# Patient Record
Sex: Male | Born: 1965 | Race: White | Hispanic: No | Marital: Married | Smoking: Never smoker
Health system: Southern US, Community
[De-identification: ages and names within clinical notes are randomized; demographics above are authoritative.]

## PROBLEM LIST (undated history)

## (undated) DIAGNOSIS — I10 Essential (primary) hypertension: Secondary | ICD-10-CM

## (undated) DIAGNOSIS — Z8249 Family history of ischemic heart disease and other diseases of the circulatory system: Secondary | ICD-10-CM

## (undated) DIAGNOSIS — E7800 Pure hypercholesterolemia, unspecified: Secondary | ICD-10-CM

## (undated) DIAGNOSIS — R2230 Localized swelling, mass and lump, unspecified upper limb: Secondary | ICD-10-CM

## (undated) HISTORY — PX: VASECTOMY: SHX75

## (undated) HISTORY — DX: Pure hypercholesterolemia, unspecified: E78.00

## (undated) HISTORY — DX: Essential (primary) hypertension: I10

## (undated) HISTORY — DX: Family history of ischemic heart disease and other diseases of the circulatory system: Z82.49

## (undated) HISTORY — DX: Localized swelling, mass and lump, unspecified upper limb: R22.30

---

## 2017-07-10 DIAGNOSIS — E7800 Pure hypercholesterolemia, unspecified: Secondary | ICD-10-CM | POA: Diagnosis not present

## 2017-08-18 DIAGNOSIS — Z1211 Encounter for screening for malignant neoplasm of colon: Secondary | ICD-10-CM | POA: Diagnosis not present

## 2017-10-17 DIAGNOSIS — E7800 Pure hypercholesterolemia, unspecified: Secondary | ICD-10-CM | POA: Diagnosis not present

## 2018-03-26 DIAGNOSIS — R591 Generalized enlarged lymph nodes: Secondary | ICD-10-CM | POA: Diagnosis not present

## 2018-04-10 DIAGNOSIS — E7800 Pure hypercholesterolemia, unspecified: Secondary | ICD-10-CM | POA: Diagnosis not present

## 2018-04-10 DIAGNOSIS — Z0000 Encounter for general adult medical examination without abnormal findings: Secondary | ICD-10-CM | POA: Diagnosis not present

## 2018-04-10 DIAGNOSIS — Z125 Encounter for screening for malignant neoplasm of prostate: Secondary | ICD-10-CM | POA: Diagnosis not present

## 2018-04-10 DIAGNOSIS — Z23 Encounter for immunization: Secondary | ICD-10-CM | POA: Diagnosis not present

## 2018-05-21 DIAGNOSIS — D225 Melanocytic nevi of trunk: Secondary | ICD-10-CM | POA: Diagnosis not present

## 2018-05-21 DIAGNOSIS — Z872 Personal history of diseases of the skin and subcutaneous tissue: Secondary | ICD-10-CM | POA: Diagnosis not present

## 2018-05-21 DIAGNOSIS — L570 Actinic keratosis: Secondary | ICD-10-CM | POA: Diagnosis not present

## 2018-07-16 DIAGNOSIS — E7800 Pure hypercholesterolemia, unspecified: Secondary | ICD-10-CM | POA: Diagnosis not present

## 2019-03-02 ENCOUNTER — Ambulatory Visit (INDEPENDENT_AMBULATORY_CARE_PROVIDER_SITE_OTHER): Payer: BC Managed Care – PPO

## 2019-03-02 ENCOUNTER — Ambulatory Visit (HOSPITAL_COMMUNITY)
Admission: EM | Admit: 2019-03-02 | Discharge: 2019-03-02 | Disposition: A | Discharge status: Home / Self Care | Payer: BC Managed Care – PPO | Attending: Emergency Medicine | Admitting: Emergency Medicine

## 2019-03-02 ENCOUNTER — Other Ambulatory Visit: Payer: Self-pay

## 2019-03-02 ENCOUNTER — Encounter (HOSPITAL_COMMUNITY): Payer: Self-pay

## 2019-03-02 DIAGNOSIS — S5001XA Contusion of right elbow, initial encounter: Secondary | ICD-10-CM | POA: Diagnosis not present

## 2019-03-02 DIAGNOSIS — S59901A Unspecified injury of right elbow, initial encounter: Secondary | ICD-10-CM | POA: Diagnosis not present

## 2019-03-02 DIAGNOSIS — M25521 Pain in right elbow: Secondary | ICD-10-CM | POA: Diagnosis not present

## 2019-03-02 MED ORDER — IBUPROFEN 600 MG PO TABS: 600.0000 mg | ORAL_TABLET | Freq: Four times a day (QID) | ORAL | 0 refills | Status: AC | PRN

## 2019-03-02 NOTE — Discharge Instructions (Signed)
Take the prescribed ibuprofen as needed for your pain.   Follow-up with the orthopedic listed or your orthopedist if your pain continues, if the numbness/tingling in your fingers continue, if you develop weakness, or other concerns.

## 2019-03-02 NOTE — ED Notes (Signed)
Bed: UC04 Expected date: 03/02/19 Expected time: 11:21 AM Means of arrival: Car Comments: APPT 11:30

## 2019-03-02 NOTE — ED Provider Notes (Signed)
[place]    CSN: [id number] Arrival date & time: 03/02/19  1112      History   Chief Complaint Chief Complaint  Patient presents with  . Arm Injury    HPI Charles James is a 53 y.o. male.   Patient presents with abrasion and pain on his right forearm and elbow after falling 2 hours PTA.  He denies head injury or LOC.  He states he slipped and fell on the steps.  He reports intermittent numbness and tingling in his right fourth and fifth fingers which is improving.  The history is provided by the patient.    History reviewed. No pertinent past medical history.  There are no active problems to display for this patient.   History reviewed. No pertinent surgical history.     Home Medications    Prior to Admission medications   Medication Sig Start Date End Date Taking? Authorizing Provider  ibuprofen (ADVIL) 600 MG tablet Take 1 tablet (600 mg total) by mouth every 6 (six) hours as needed. 03/02/19   Sharion Balloon, NP    Family History History reviewed. No pertinent family history.  Social History Social History   Tobacco Use  . Smoking status: Never Smoker  . Smokeless tobacco: Never Used  Substance Use Topics  . Alcohol use: Not on file  . Drug use: Not on file     Allergies   Patient has no known allergies.   Review of Systems Review of Systems  Constitutional: Negative for chills and fever.  HENT: Negative for ear pain and sore throat.   Eyes: Negative for pain and visual disturbance.  Respiratory: Negative for cough and shortness of breath.   Cardiovascular: Negative for chest pain and palpitations.  Gastrointestinal: Negative for abdominal pain and vomiting.  Genitourinary: Negative for dysuria and hematuria.  Musculoskeletal: Positive for arthralgias. Negative for back pain.  Skin: Positive for wound. Negative for color change and rash.  Neurological: Negative for seizures and syncope.  All other systems reviewed and are negative.     Physical Exam Triage Vital Signs ED Triage Vitals  Enc Vitals Group     BP 03/02/19 1136 128/79     Pulse Rate 03/02/19 1136 68     Resp 03/02/19 1136 16     Temp 03/02/19 1136 98.7 F (37.1 C)     Temp Source 03/02/19 1136 Oral     SpO2 03/02/19 1136 100 %     Weight --      Height --      Head Circumference --      Peak Flow --      Pain Score 03/02/19 1137 3     Pain Loc --      Pain Edu? --      Excl. in [place]? --    No data found.  Updated Vital Signs BP 128/79 (BP Location: Left Arm)   Pulse 68   Temp 98.7 F (37.1 C) (Oral)   Resp 16   SpO2 100%   Visual Acuity Right Eye Distance:   Left Eye Distance:   Bilateral Distance:    Right Eye Near:   Left Eye Near:    Bilateral Near:     Physical Exam Vitals signs and nursing note reviewed.  Constitutional:      Appearance: He is well-developed.  HENT:     Head: Normocephalic and atraumatic.     Right Ear: Tympanic membrane normal.     Left Ear:  Tympanic membrane normal.     Nose: Nose normal.     Mouth/Throat:     Mouth: Mucous membranes are moist.     Pharynx: Oropharynx is clear.  Eyes:     Conjunctiva/sclera: Conjunctivae normal.  Neck:     Musculoskeletal: Neck supple.  Cardiovascular:     Rate and Rhythm: Normal rate and regular rhythm.     Heart sounds: No murmur.  Pulmonary:     Effort: Pulmonary effort is normal. No respiratory distress.     Breath sounds: Normal breath sounds.  Abdominal:     General: Bowel sounds are normal.     Palpations: Abdomen is soft.     Tenderness: There is no abdominal tenderness. There is no guarding or rebound.  Musculoskeletal: Normal range of motion.        General: Tenderness present. No swelling or deformity.     Comments: Right elbow and forearm: abrasion and tender to palpation.   Skin:    General: Skin is warm and dry.     Capillary Refill: Capillary refill takes less than 2 seconds.  Neurological:     General: No focal deficit present.      Mental Status: He is alert and oriented to person, place, and time.     Sensory: No sensory deficit.     Motor: No weakness.      UC Treatments / Results  Labs (all labs ordered are listed, but only abnormal results are displayed) Labs Reviewed - No data to display  EKG   Radiology Dg Elbow Complete Right  Result Date: 03/02/2019 CLINICAL DATA:  Per pt: fell this morning around 9:30, landed on the right back of the forearm. Patient pointed to the posterior olecranon, radiating pain distally. No prior injury to the right forearm. Patient is not a diabetic pain EXAM: RIGHT ELBOW - COMPLETE 3+ VIEW COMPARISON:  None. FINDINGS: No evidence of fracture of the ulna or humerus. The radial head is normal. No joint effusion. IMPRESSION: No fracture or dislocation. Electronically Signed   By: Genevive BiStewart  Edmunds M.D.   On: 03/02/2019 12:22    Procedures Procedures (including critical care time)  Medications Ordered in UC Medications - No data to display  Initial Impression / Assessment and Plan / UC Course  I have reviewed the triage vital signs and the nursing notes.  Pertinent labs & imaging results that were available during my care of the patient were reviewed by me and considered in my medical decision making (see chart for details).    Contusion of right elbow.  X-ray negative.  Treating with ibuprofen.  Instructed patient to follow-up with an orthopedist if his pain continues, if the numbness and tingling in his fingers continue, if he develops weakness, or other concerns.  Patient agrees to plan of care.     Final Clinical Impressions(s) / UC Diagnoses   Final diagnoses:  Contusion of right elbow, initial encounter     Discharge Instructions     Take the prescribed ibuprofen as needed for your pain.   Follow-up with the orthopedic listed or your orthopedist if your pain continues, if the numbness/tingling in your fingers continue, if you develop weakness, or other concerns.          ED Prescriptions    Medication Sig Dispense Auth. Provider   ibuprofen (ADVIL) 600 MG tablet Take 1 tablet (600 mg total) by mouth every 6 (six) hours as needed. 30 tablet Mickie Bailate, Latice Waitman H, NP  PDMP not reviewed this encounter.   Mickie Bail, NP 03/02/19 1242

## 2019-03-02 NOTE — ED Triage Notes (Signed)
Pt present right elbow injury from slipping down some steps.  Pt states he was at the second step from the bottom when he slipped. Pt right elbow is bruised

## 2019-05-20 DIAGNOSIS — Z0000 Encounter for general adult medical examination without abnormal findings: Secondary | ICD-10-CM | POA: Diagnosis not present

## 2019-05-24 DIAGNOSIS — Z5181 Encounter for therapeutic drug level monitoring: Secondary | ICD-10-CM | POA: Diagnosis not present

## 2019-05-24 DIAGNOSIS — E7800 Pure hypercholesterolemia, unspecified: Secondary | ICD-10-CM | POA: Diagnosis not present

## 2019-05-24 DIAGNOSIS — Z125 Encounter for screening for malignant neoplasm of prostate: Secondary | ICD-10-CM | POA: Diagnosis not present

## 2019-07-06 DIAGNOSIS — Z20822 Contact with and (suspected) exposure to covid-19: Secondary | ICD-10-CM | POA: Diagnosis not present

## 2019-07-08 DIAGNOSIS — D225 Melanocytic nevi of trunk: Secondary | ICD-10-CM | POA: Diagnosis not present

## 2019-07-08 DIAGNOSIS — L814 Other melanin hyperpigmentation: Secondary | ICD-10-CM | POA: Diagnosis not present

## 2019-07-08 DIAGNOSIS — X32XXXS Exposure to sunlight, sequela: Secondary | ICD-10-CM | POA: Diagnosis not present

## 2019-07-08 DIAGNOSIS — G548 Other nerve root and plexus disorders: Secondary | ICD-10-CM | POA: Diagnosis not present

## 2019-08-17 ENCOUNTER — Ambulatory Visit: Payer: BC Managed Care – PPO | Attending: Internal Medicine

## 2019-08-17 DIAGNOSIS — Z23 Encounter for immunization: Secondary | ICD-10-CM

## 2019-08-17 NOTE — Progress Notes (Signed)
   Covid-19 Vaccination Clinic  Name:  Charles James    MRN: [id number] DOB: 01/03/1966  08/17/2019  [name] was observed post Covid-19 immunization for 15 minutes without incident. He was provided with Vaccine Information Sheet and instruction to access the V-Safe system.   [name] was instructed to call 911 with any severe reactions post vaccine: Marland Kitchen Difficulty breathing  . Swelling of face and throat  . A fast heartbeat  . A bad rash all over body  . Dizziness and weakness   Immunizations Administered    Name Date Dose VIS Date Route   Pfizer COVID-19 Vaccine 08/17/2019 10:36 AM 0.3 mL 05/17/2019 Intramuscular   Manufacturer: [employer], [employer]   Lot: [id number]   NDC: [address]-5

## 2019-09-09 ENCOUNTER — Ambulatory Visit: Payer: BC Managed Care – PPO | Attending: Internal Medicine

## 2019-09-09 ENCOUNTER — Ambulatory Visit: Payer: BC Managed Care – PPO

## 2019-09-09 DIAGNOSIS — Z23 Encounter for immunization: Secondary | ICD-10-CM

## 2019-09-09 NOTE — Progress Notes (Signed)
   Covid-19 Vaccination Clinic  Name:  Charles James    MRN: [id number] DOB: December 26, 1965  09/09/2019  [name] was observed post Covid-19 immunization for 15 minutes without incident. He was provided with Vaccine Information Sheet and instruction to access the V-Safe system.   [name] was instructed to call 911 with any severe reactions post vaccine: Marland Kitchen Difficulty breathing  . Swelling of face and throat  . A fast heartbeat  . A bad rash all over body  . Dizziness and weakness   Immunizations Administered    Name Date Dose VIS Date Route   Pfizer COVID-19 Vaccine 09/09/2019  3:39 PM 0.3 mL 05/17/2019 Intramuscular   Manufacturer: [employer], [employer]   Lot: [id number]   NDC: [address]-2

## 2020-02-07 DIAGNOSIS — Z20822 Contact with and (suspected) exposure to covid-19: Secondary | ICD-10-CM | POA: Diagnosis not present

## 2020-02-12 DIAGNOSIS — Z20822 Contact with and (suspected) exposure to covid-19: Secondary | ICD-10-CM | POA: Diagnosis not present

## 2020-02-19 DIAGNOSIS — S30861A Insect bite (nonvenomous) of abdominal wall, initial encounter: Secondary | ICD-10-CM | POA: Diagnosis not present

## 2020-02-19 DIAGNOSIS — R509 Fever, unspecified: Secondary | ICD-10-CM | POA: Diagnosis not present

## 2020-02-19 DIAGNOSIS — R5383 Other fatigue: Secondary | ICD-10-CM | POA: Diagnosis not present

## 2020-02-19 DIAGNOSIS — R0789 Other chest pain: Secondary | ICD-10-CM | POA: Diagnosis not present

## 2020-02-20 ENCOUNTER — Ambulatory Visit
Admission: RE | Admit: 2020-02-20 | Discharge: 2020-02-20 | Disposition: A | Discharge status: Home / Self Care | Payer: BC Managed Care – PPO | Source: Ambulatory Visit | Attending: Physician Assistant | Admitting: Physician Assistant

## 2020-02-20 ENCOUNTER — Other Ambulatory Visit: Payer: Self-pay | Admitting: Physician Assistant

## 2020-02-20 DIAGNOSIS — R509 Fever, unspecified: Secondary | ICD-10-CM

## 2020-07-06 ENCOUNTER — Ambulatory Visit
Admission: RE | Admit: 2020-07-06 | Discharge: 2020-07-06 | Disposition: A | Discharge status: Home / Self Care | Payer: BC Managed Care – PPO | Source: Ambulatory Visit | Attending: Physician Assistant | Admitting: Physician Assistant

## 2020-07-06 ENCOUNTER — Other Ambulatory Visit: Payer: Self-pay | Admitting: Physician Assistant

## 2020-07-06 DIAGNOSIS — M7989 Other specified soft tissue disorders: Secondary | ICD-10-CM

## 2020-07-26 NOTE — Progress Notes (Signed)
Cardiology Office Note:    Date:  07/29/2020   ID:  Charles James, DOB 1966/05/10, MRN [id number]  PCP:  Milus Height, PA   [facility]  Cardiologist:  No primary care provider on file.  Advanced Practice Provider:  No care team member to display Electrophysiologist:  None    Referring MD: Milus Height, PA     History of Present Illness:    Charles James is a 55 y.o. male with a strong family history of coronary artery disease who was referred by Milus Height, PA for further evaluation of cardiac risk factors.  Patient states that he overall feels well. No exertional chest pain, SOB, lightheadedness, dizziness or syncope. No LE edema or orthopnea. Was on crestor 5mg  but had muscle aches with it so it was stopped. Patient is active and works out 20-70min per day 5-6 day/week.   Family history: Brother with MI 50 and required bypass (not smoker); younger brother with hypertension. Mother with HTN, HLD. Father with HTN, HLD.   Past Medical History:  Diagnosis Date  . Family history of coronary artery bypass surgery   . High cholesterol   . Hypertension   . Mass of shoulder region     Past Surgical History:  Procedure Laterality Date  . VASECTOMY      Current Medications: Current Meds  Medication Sig  . cetirizine (ZYRTEC) 10 MG tablet Take 10 mg by mouth daily.  52 ibuprofen (ADVIL) 600 MG tablet Take 1 tablet (600 mg total) by mouth every 6 (six) hours as needed.  . Multiple Vitamins-Minerals (ONE DAILY FOR MEN/LYCOPENE PO) Take by mouth.     Allergies:   Atorvastatin   Social History   Socioeconomic History  . Marital status: Married    Spouse name: Not on file  . Number of children: Not on file  . Years of education: Not on file  . Highest education level: Not on file  Occupational History  . Not on file  Tobacco Use  . Smoking status: Never Smoker  . Smokeless tobacco: Never Used  Substance and Sexual Activity  . Alcohol  use: Yes  . Drug use: Not on file  . Sexual activity: Yes    Comment: married  Other Topics Concern  . Not on file  Social History Narrative  . Not on file   Social Determinants of Health   Financial Resource Strain: Not on file  Food Insecurity: Not on file  Transportation Needs: Not on file  Physical Activity: Not on file  Stress: Not on file  Social Connections: Not on file     Family History: The patient's family history includes Heart disease in his brother; Hypertension in his brother, brother, father, and mother.  ROS:   Please see the history of present illness.    Review of Systems  Constitutional: Negative for chills and fever.  HENT: Negative for hearing loss and sore throat.   Eyes: Negative for blurred vision and redness.  Respiratory: Negative for cough and shortness of breath.   Cardiovascular: Negative for chest pain, palpitations, orthopnea, claudication, leg swelling and PND.  Gastrointestinal: Negative for melena, nausea and vomiting.  Genitourinary: Negative for dysuria and flank pain.  Musculoskeletal: Positive for joint pain. Negative for falls and myalgias.  Skin: Negative for rash.  Neurological: Negative for dizziness and loss of consciousness.  Endo/Heme/Allergies: Negative for polydipsia.  Psychiatric/Behavioral: Negative for substance abuse.    EKGs/Labs/Other Studies Reviewed:    The following  studies were reviewed today: No cardiac studies  EKG:  EKG is  ordered today.  The ekg ordered today demonstrates NSR, LAFB, HR 79  Recent Labs: No results found for requested labs within last 8760 hours.  Recent Lipid Panel No results found for: CHOL, TRIG, HDL, CHOLHDL, VLDL, LDLCALC, LDLDIRECT     Physical Exam:    VS:  BP 138/86   Pulse 79   Ht 5\' 10"  (1.778 m)   Wt 179 lb (81.2 kg)   SpO2 98%   BMI 25.68 kg/m     Wt Readings from Last 3 Encounters:  07/29/20 179 lb (81.2 kg)     GEN:  Well nourished, well developed in no acute  distress HEENT: Normal NECK: No JVD; No carotid bruits CARDIAC: RRR, 1/6 systolic murmur. No rubs, gallops RESPIRATORY:  Clear to auscultation without rales, wheezing or rhonchi  ABDOMEN: Soft, non-tender, non-distended MUSCULOSKELETAL:  No edema; No deformity  SKIN: Warm and dry NEUROLOGIC:  Alert and oriented x 3 PSYCHIATRIC:  Normal affect   ASSESSMENT:    1. Family history of coronary artery bypass graft    PLAN:    In order of problems listed above:  #Strong Family History of CAD: #CV Risk Stratification: Patient with a history of premature CAD with a brother with history of MI requiring bypass surgery at age 16. Patient is currently asymptomatic and active exercising 20-26min a day 6x/week. Was previously on crestor but did not tolerate due to myalgias. LDL 112. Will risk stratify with coronary calcium score. -Coronary calcium score -Pending calcium score, can consider re-trialing statin +/- ASA -Continue lifestyle modifications with diet and exercise as detailed below  Exercise recommendations: Goal of exercising for at least 30 minutes a day, at least 5 times per week.  Please exercise to a moderate exertion.  This means that while exercising it is difficult to speak in full sentences, however you are not so short of breath that you feel you must stop, and not so comfortable that you can carry on a full conversation.  Exertion level should be approximately a 5/10, if 10 is the most exertion you can perform.  Diet recommendations: Recommend a heart healthy diet such as the Mediterranean diet.  This diet consists of plant based foods, healthy fats, lean meats, olive oil.  It suggests limiting the intake of simple carbohydrates such as white breads, pastries, and pastas.  It also limits the amount of red meat, wine, and dairy products such as cheese that one should consume on a daily basis.   Medication Adjustments/Labs and Tests Ordered: Current medicines are reviewed at  length with the patient today.  Concerns regarding medicines are outlined above.  Orders Placed This Encounter  Procedures  . CT CARDIAC SCORING (SELF PAY ONLY)  . EKG 12-Lead   No orders of the defined types were placed in this encounter.   Patient Instructions  Medication Instructions:  Your physician recommends that you continue on your current medications as directed. Please refer to the Current Medication list given to you today.  *If you need a refill on your cardiac medications before your next appointment, please call your pharmacy*   Lab Work: None ordered  If you have labs (blood work) drawn today and your tests are completely normal, you will receive your results only by: 31m MyChart Message (if you have MyChart) OR . A paper copy in the mail If you have any lab test that is abnormal or we need to change your  treatment, we will call you to review the results.   Testing/Procedures: Your physician recommends that you have a CT Calcium Score.   Follow-Up: At [facility] you and your health needs are our priority.  As part of our continuing mission to provide you with exceptional heart care, we have created designated Provider Care Teams.  These Care Teams include your primary Cardiologist (physician) and Advanced Practice Providers (APPs -  Physician Assistants and Nurse Practitioners) who all work together to provide you with the care you need, when you need it.  We recommend signing up for the patient portal called "MyChart".  Sign up information is provided on this After Visit Summary.  MyChart is used to connect with patients for Virtual Visits (Telemedicine).  Patients are able to view lab/test results, encounter notes, upcoming appointments, etc.  Non-urgent messages can be sent to your provider as well.   To learn more about what you can do with MyChart, go to [email or web address].    Your next appointment:   12 month(s)  The format for your next appointment:    In Person  Provider:   Laurance Flatten, MD   Other Instructions      Signed, Meriam Sprague, MD  07/29/2020 9:23 AM    [facility]

## 2020-07-29 ENCOUNTER — Other Ambulatory Visit: Payer: Self-pay

## 2020-07-29 ENCOUNTER — Encounter: Payer: Self-pay | Admitting: Cardiology

## 2020-07-29 ENCOUNTER — Ambulatory Visit: Payer: BC Managed Care – PPO | Admitting: Cardiology

## 2020-07-29 VITALS — BP 138/86 | HR 79 | Ht 70.0 in | Wt 179.0 lb

## 2020-07-29 DIAGNOSIS — Z8249 Family history of ischemic heart disease and other diseases of the circulatory system: Secondary | ICD-10-CM | POA: Diagnosis not present

## 2020-07-29 NOTE — Progress Notes (Signed)
Cardiology Office Note:    Date:  07/29/2020   ID:  Charles James, DOB 14-Mar-1966, MRN [id number]  PCP:  Milus Height, PA   [facility]  Cardiologist:  No primary care provider on file.  Advanced Practice Provider:  No care team member to display Electrophysiologist:  None    Referring MD: Milus Height, PA     History of Present Illness:    Charles James is a 55 y.o. male with a strong family history of coronary artery disease who was referred by Milus Height, PA for further evaluation of cardiac risk factors.  Patient states that he overall feels well. No exertional chest pain, SOB, lightheadedness, dizziness or syncope. No LE edema or orthopnea. Was on crestor 5mg  but had muscle aches with it so it was stopped. LDL 112. Patient is active and works out 20-54min per day 5-6 day/week. Blood pressures at home run 120-140s but most often in 120s. Not on blood pressure medication.  Family history: Brother with MI 39 and required bypass (not smoker); younger brother with hypertension. Mother with HTN, HLD. Father with HTN, HLD.   Past Medical History:  Diagnosis Date  . Family history of coronary artery bypass surgery   . High cholesterol   . Hypertension   . Mass of shoulder region     Past Surgical History:  Procedure Laterality Date  . VASECTOMY      Current Medications: Current Meds  Medication Sig  . cetirizine (ZYRTEC) 10 MG tablet Take 10 mg by mouth daily.  52 ibuprofen (ADVIL) 600 MG tablet Take 1 tablet (600 mg total) by mouth every 6 (six) hours as needed.  . Multiple Vitamins-Minerals (ONE DAILY FOR MEN/LYCOPENE PO) Take by mouth.     Allergies:   Atorvastatin   Social History   Socioeconomic History  . Marital status: Married    Spouse name: Not on file  . Number of children: Not on file  . Years of education: Not on file  . Highest education level: Not on file  Occupational History  . Not on file  Tobacco Use  . Smoking  status: Never Smoker  . Smokeless tobacco: Never Used  Substance and Sexual Activity  . Alcohol use: Yes  . Drug use: Not on file  . Sexual activity: Yes    Comment: married  Other Topics Concern  . Not on file  Social History Narrative  . Not on file   Social Determinants of Health   Financial Resource Strain: Not on file  Food Insecurity: Not on file  Transportation Needs: Not on file  Physical Activity: Not on file  Stress: Not on file  Social Connections: Not on file     Family History: The patient's family history includes Heart disease in his brother; Hypertension in his brother, brother, father, and mother.  ROS:   Please see the history of present illness.    Review of Systems  Constitutional: Negative for chills and fever.  HENT: Negative for nosebleeds.   Eyes: Negative for blurred vision and redness.  Respiratory: Negative for shortness of breath.   Cardiovascular: Negative for chest pain, palpitations, orthopnea, claudication, leg swelling and PND.  Gastrointestinal: Negative for melena, nausea and vomiting.  Genitourinary: Negative for frequency.  Musculoskeletal: Positive for joint pain. Negative for falls and myalgias.  Neurological: Negative for dizziness and loss of consciousness.  Endo/Heme/Allergies: Negative for polydipsia.  Psychiatric/Behavioral: Negative for substance abuse.    EKGs/Labs/Other Studies Reviewed:  The following studies were reviewed today: No cardiac studies  EKG:  EKG is  ordered today.  The ekg ordered today demonstrates NSR with HR 79; LAFB  Recent Labs: No results found for requested labs within last 8760 hours.  Recent Lipid Panel No results found for: CHOL, TRIG, HDL, CHOLHDL, VLDL, LDLCALC, LDLDIRECT   Risk Assessment/Calculations:       Physical Exam:    VS:  BP 138/86   Pulse 79   Ht 5\' 10"  (1.778 m)   Wt 179 lb (81.2 kg)   SpO2 98%   BMI 25.68 kg/m     Wt Readings from Last 3 Encounters:  07/29/20  179 lb (81.2 kg)     GEN:  Well nourished, well developed in no acute distress HEENT: Normal NECK: No JVD; No carotid bruits CARDIAC: RRR, 1/6 systolic murmur. No rubs, gallops RESPIRATORY:  Clear to auscultation without rales, wheezing or rhonchi  ABDOMEN: Soft, non-tender, non-distended MUSCULOSKELETAL:  No edema; No deformity  SKIN: Warm and dry NEUROLOGIC:  Alert and oriented x 3 PSYCHIATRIC:  Normal affect   ASSESSMENT:    1. Family history of coronary artery bypass graft    PLAN:    In order of problems listed above:  #Strong Family History of CAD: #CV Risk Stratification: Patient has strong family history of CAD where brother had MI requiring bypass surgery at age 52. He is currently asymptomatic and active. LDL 112 off crestor currently due to muscle aches. Blood pressure generally 120s at home off medications. Exercises 20-46min a day 6 days per week without issues. Will risk stratify with coronary calcium score -Coronary calcium score -Pending score, will consider trial of different statin +/- ASA      Medication Adjustments/Labs and Tests Ordered: Current medicines are reviewed at length with the patient today.  Concerns regarding medicines are outlined above.  Orders Placed This Encounter  Procedures  . CT CARDIAC SCORING (SELF PAY ONLY)  . EKG 12-Lead   No orders of the defined types were placed in this encounter.   Patient Instructions  Medication Instructions:  Your physician recommends that you continue on your current medications as directed. Please refer to the Current Medication list given to you today.  *If you need a refill on your cardiac medications before your next appointment, please call your pharmacy*   Lab Work: None ordered  If you have labs (blood work) drawn today and your tests are completely normal, you will receive your results only by: 31m MyChart Message (if you have MyChart) OR . A paper copy in the mail If you have any lab test  that is abnormal or we need to change your treatment, we will call you to review the results.   Testing/Procedures: Your physician recommends that you have a CT Calcium Score.   Follow-Up: At [facility], you and your health needs are our priority.  As part of our continuing mission to provide you with exceptional heart care, we have created designated Provider Care Teams.  These Care Teams include your primary Cardiologist (physician) and Advanced Practice Providers (APPs -  Physician Assistants and Nurse Practitioners) who all work together to provide you with the care you need, when you need it.  We recommend signing up for the patient portal called "MyChart".  Sign up information is provided on this After Visit Summary.  MyChart is used to connect with patients for Virtual Visits (Telemedicine).  Patients are able to view lab/test results, encounter notes, upcoming appointments, etc.  Non-urgent messages can be sent to your provider as well.   To learn more about what you can do with MyChart, go to [email or web address].    Your next appointment:   12 month(s)  The format for your next appointment:   In Person  Provider:   Laurance Flatten, MD   Other Instructions      Signed, Meriam Sprague, MD  07/29/2020 9:16 AM    [facility]

## 2020-07-29 NOTE — Patient Instructions (Addendum)
Medication Instructions:  Your physician recommends that you continue on your current medications as directed. Please refer to the Current Medication list given to you today.  *If you need a refill on your cardiac medications before your next appointment, please call your pharmacy*   Lab Work: None ordered  If you have labs (blood work) drawn today and your tests are completely normal, you will receive your results only by: Marland Kitchen MyChart Message (if you have MyChart) OR . A paper copy in the mail If you have any lab test that is abnormal or we need to change your treatment, we will call you to review the results.   Testing/Procedures: Your physician recommends that you have a CT Calcium Score.   Follow-Up: At [facility], you and your health needs are our priority.  As part of our continuing mission to provide you with exceptional heart care, we have created designated Provider Care Teams.  These Care Teams include your primary Cardiologist (physician) and Advanced Practice Providers (APPs -  Physician Assistants and Nurse Practitioners) who all work together to provide you with the care you need, when you need it.  We recommend signing up for the patient portal called "MyChart".  Sign up information is provided on this After Visit Summary.  MyChart is used to connect with patients for Virtual Visits (Telemedicine).  Patients are able to view lab/test results, encounter notes, upcoming appointments, etc.  Non-urgent messages can be sent to your provider as well.   To learn more about what you can do with MyChart, go to [email or web address].    Your next appointment:   12 month(s)  The format for your next appointment:   In Person  Provider:   Laurance Flatten, MD   Other Instructions

## 2020-08-18 ENCOUNTER — Other Ambulatory Visit: Payer: Self-pay

## 2020-08-18 ENCOUNTER — Ambulatory Visit (INDEPENDENT_AMBULATORY_CARE_PROVIDER_SITE_OTHER)
Admission: RE | Admit: 2020-08-18 | Discharge: 2020-08-18 | Disposition: A | Discharge status: Home / Self Care | Payer: Self-pay | Source: Ambulatory Visit | Attending: Cardiology | Admitting: Cardiology

## 2020-08-18 DIAGNOSIS — Z8249 Family history of ischemic heart disease and other diseases of the circulatory system: Secondary | ICD-10-CM

## 2020-08-20 ENCOUNTER — Telehealth: Payer: Self-pay | Admitting: Cardiology

## 2020-08-20 NOTE — Telephone Encounter (Signed)
Patient is calling to get results from CT

## 2020-08-20 NOTE — Telephone Encounter (Signed)
Per Dr. Shari Prows, his calcium score is 0. This is great news and means there is no significant plaque in his arteries. He does not need a statin or aspirin at this time but needs to continue healthy diet and daily exercise. Patient verbalized understanding.

## 2021-07-14 DIAGNOSIS — L578 Other skin changes due to chronic exposure to nonionizing radiation: Secondary | ICD-10-CM | POA: Diagnosis not present

## 2021-07-14 DIAGNOSIS — L814 Other melanin hyperpigmentation: Secondary | ICD-10-CM | POA: Diagnosis not present

## 2021-07-14 DIAGNOSIS — X32XXXS Exposure to sunlight, sequela: Secondary | ICD-10-CM | POA: Diagnosis not present

## 2021-07-14 DIAGNOSIS — D225 Melanocytic nevi of trunk: Secondary | ICD-10-CM | POA: Diagnosis not present

## 2021-07-14 DIAGNOSIS — L570 Actinic keratosis: Secondary | ICD-10-CM | POA: Diagnosis not present

## 2021-07-21 DIAGNOSIS — Z0000 Encounter for general adult medical examination without abnormal findings: Secondary | ICD-10-CM | POA: Diagnosis not present

## 2021-07-21 DIAGNOSIS — F339 Major depressive disorder, recurrent, unspecified: Secondary | ICD-10-CM | POA: Diagnosis not present

## 2021-07-21 DIAGNOSIS — E7800 Pure hypercholesterolemia, unspecified: Secondary | ICD-10-CM | POA: Diagnosis not present

## 2021-07-21 DIAGNOSIS — Z131 Encounter for screening for diabetes mellitus: Secondary | ICD-10-CM | POA: Diagnosis not present

## 2021-07-21 DIAGNOSIS — Z125 Encounter for screening for malignant neoplasm of prostate: Secondary | ICD-10-CM | POA: Diagnosis not present

## 2021-08-12 IMAGING — CT CT CARDIAC CORONARY ARTERY CALCIUM SCORE
3 series · 14 of 20 positions shown, 15 images · non-contrast
Comparison: None.
COMPARISON: None.

Addendum:
EXAM:
OVER-READ INTERPRETATION  CT CHEST

The following report is an over-read performed by radiologist Dr.
Jun Hoffmann [REDACTED] on 08/18/2020. This
over-read does not include interpretation of cardiac or coronary
anatomy or pathology. The coronary calcium score interpretation by
the cardiologist is attached.
CLINICAL DATA: 55M with hypertension, hyperlipidemia and family
history of premature CAD for risk stratification
Coronary Calcium Score
TECHNIQUE: The patient was scanned on a Siemens Force scanner. Axial
non-contrast 3 mm slices were carried out through the heart. The
data set was analyzed on a dedicated work station and scored using
the Agatson method.

[Series 2: casc 3.0 bv41 2 bestdiast 73 % · axial · 0.46mm/px · z∈[-229,-157]mm · 4 of 42 slices shown, 5 images]
[im 9/42  vessel]
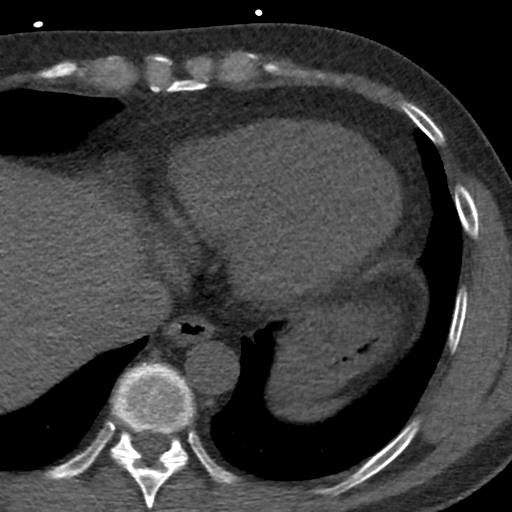
[im 9/42  lung]
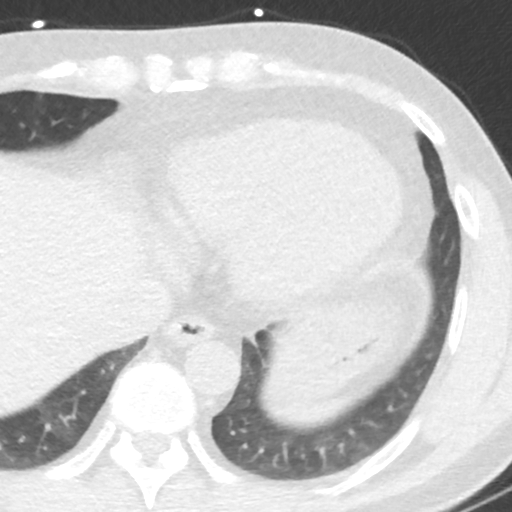
[im 17/42  vessel]
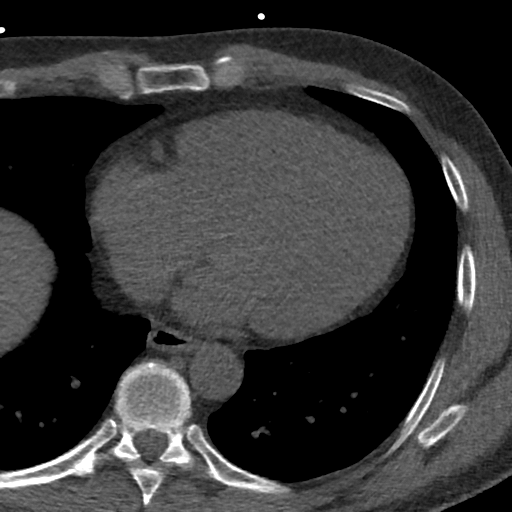
[im 25/42  vessel]
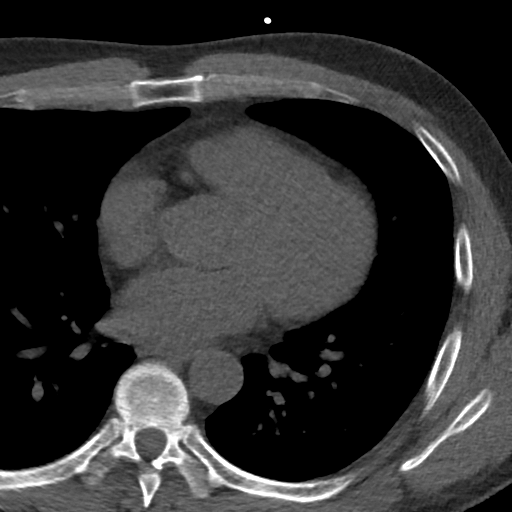
[im 33/42  vessel]
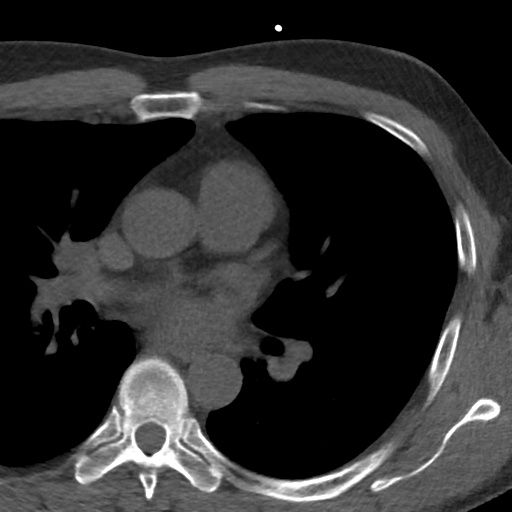

[Series 3: lung 73 % · axial · 0.72mm/px · z∈[-235,-151]mm · 5 of 43 slices shown]
[im 8/43  lung]
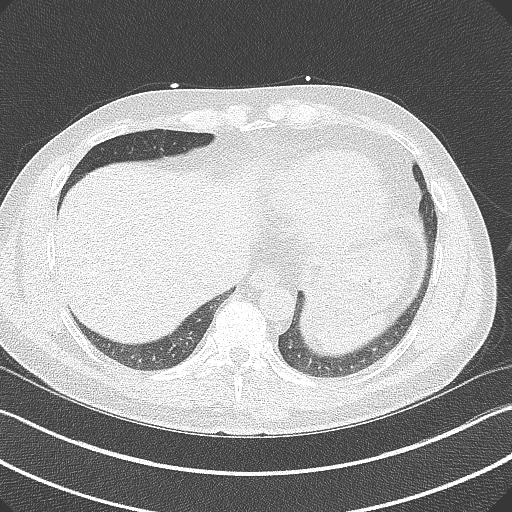
[im 15/43  lung]
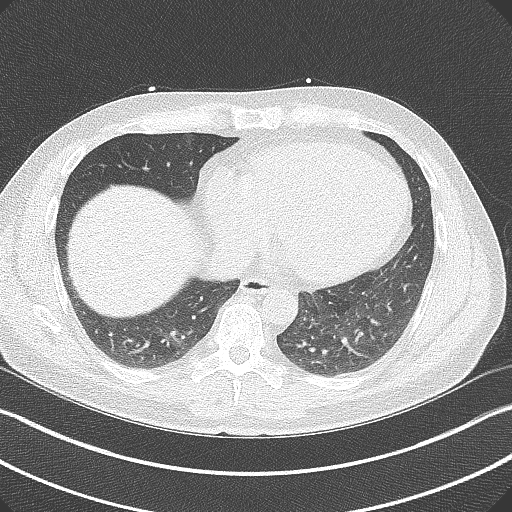
[im 22/43  lung]
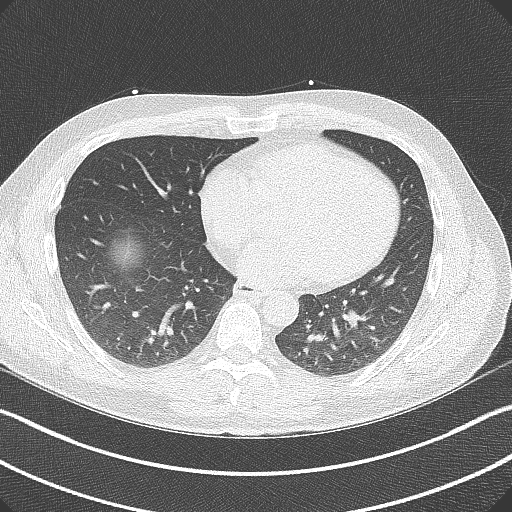
[im 29/43  lung]
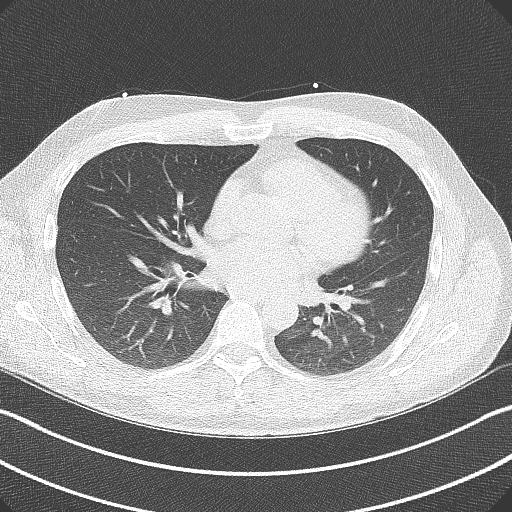
[im 36/43  lung]
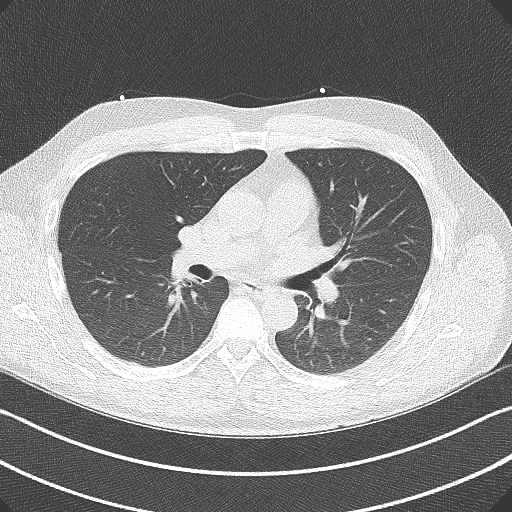

[Series 4: lung st 73 % · axial · 0.72mm/px · z∈[-235,-151]mm · 5 of 43 slices shown]
[im 8/43  lung]
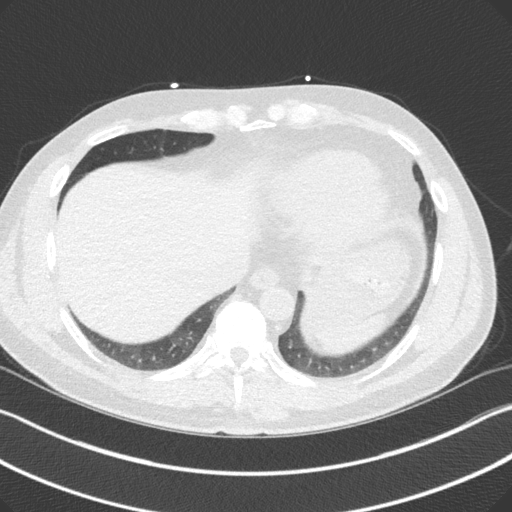
[im 15/43  lung]
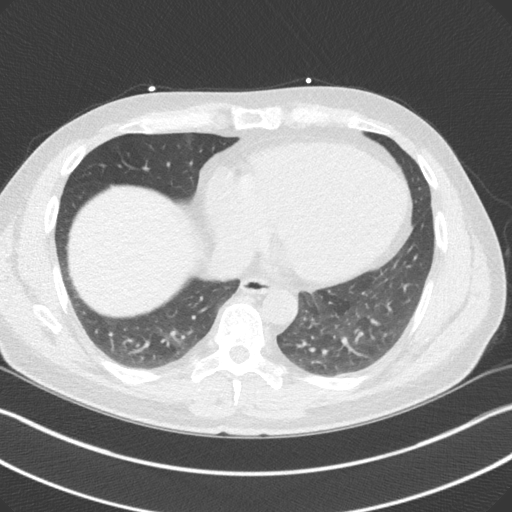
[im 22/43  lung]
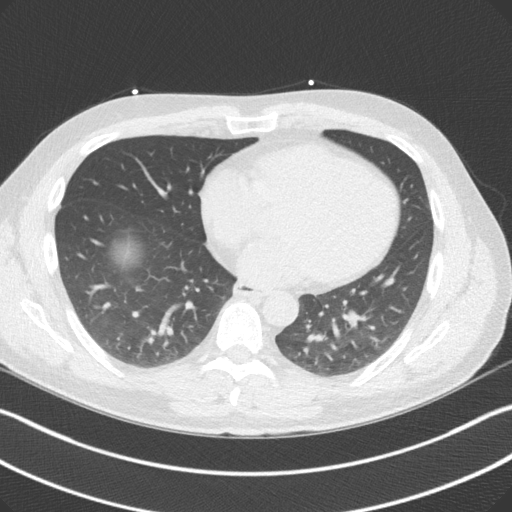
[im 29/43  lung]
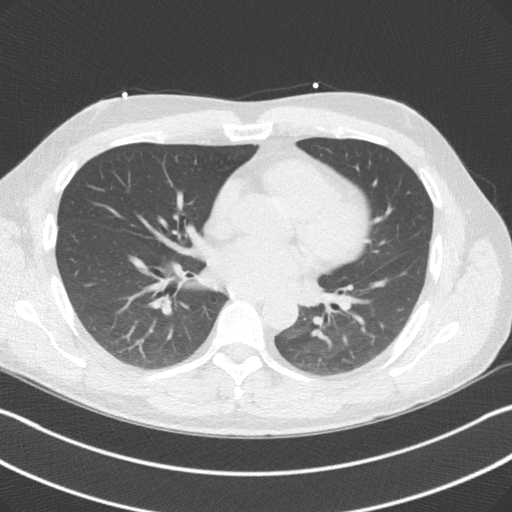
[im 36/43  lung]
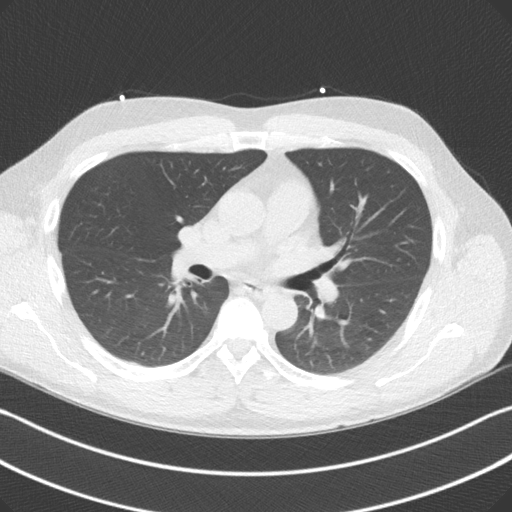

[14 of 20 positions shown; findings below may reference images not displayed]

FINDINGS: Within the visualized portions of the thorax there are no suspicious
appearing pulmonary nodules or masses, there is no acute
consolidative airspace disease, no pleural effusions, no
pneumothorax and no lymphadenopathy. Visualized portions of the
upper abdomen are unremarkable. There are no aggressive appearing
lytic or blastic lesions noted in the visualized portions of the
skeleton.
IMPRESSION: 1. No significant incidental noncardiac findings are noted.
FINDINGS: Non-cardiac: See separate report from [REDACTED].

Ascending Aorta: Normal caliber.  Ascending aorta 3.1 cm.

Pericardium: Normal

Coronary arteries: Normal origins and anatomy. No coronary
calcification.
IMPRESSION: Coronary calcium score of 0. This was 0 percentile for age and sex
matched controls.

*** End of Addendum ***
EXAM:
OVER-READ INTERPRETATION  CT CHEST

The following report is an over-read performed by radiologist Dr.
Jun Hoffmann [REDACTED] on 08/18/2020. This
over-read does not include interpretation of cardiac or coronary
anatomy or pathology. The coronary calcium score interpretation by
the cardiologist is attached.
FINDINGS: Within the visualized portions of the thorax there are no suspicious
appearing pulmonary nodules or masses, there is no acute
consolidative airspace disease, no pleural effusions, no
pneumothorax and no lymphadenopathy. Visualized portions of the
upper abdomen are unremarkable. There are no aggressive appearing
lytic or blastic lesions noted in the visualized portions of the
skeleton.
IMPRESSION: 1. No significant incidental noncardiac findings are noted.

## 2022-04-27 DIAGNOSIS — L570 Actinic keratosis: Secondary | ICD-10-CM | POA: Diagnosis not present

## 2022-07-22 DIAGNOSIS — Z125 Encounter for screening for malignant neoplasm of prostate: Secondary | ICD-10-CM | POA: Diagnosis not present

## 2022-07-22 DIAGNOSIS — Z0000 Encounter for general adult medical examination without abnormal findings: Secondary | ICD-10-CM | POA: Diagnosis not present

## 2022-07-22 DIAGNOSIS — E7800 Pure hypercholesterolemia, unspecified: Secondary | ICD-10-CM | POA: Diagnosis not present

## 2022-10-17 ENCOUNTER — Other Ambulatory Visit (HOSPITAL_COMMUNITY): Payer: Self-pay

## 2023-06-26 ENCOUNTER — Ambulatory Visit (HOSPITAL_BASED_OUTPATIENT_CLINIC_OR_DEPARTMENT_OTHER)
Admission: RE | Admit: 2023-06-26 | Discharge: 2023-06-26 | Disposition: A | Discharge status: Home / Self Care | Payer: BC Managed Care – PPO | Source: Ambulatory Visit

## 2023-06-26 ENCOUNTER — Other Ambulatory Visit (HOSPITAL_BASED_OUTPATIENT_CLINIC_OR_DEPARTMENT_OTHER): Payer: Self-pay

## 2023-06-26 ENCOUNTER — Encounter (HOSPITAL_BASED_OUTPATIENT_CLINIC_OR_DEPARTMENT_OTHER): Payer: Self-pay

## 2023-06-26 VITALS — BP 135/78 | HR 70 | Temp 98.9°F | Resp 18

## 2023-06-26 DIAGNOSIS — R509 Fever, unspecified: Secondary | ICD-10-CM | POA: Diagnosis not present

## 2023-06-26 DIAGNOSIS — J0140 Acute pansinusitis, unspecified: Secondary | ICD-10-CM | POA: Diagnosis not present

## 2023-06-26 DIAGNOSIS — R519 Headache, unspecified: Secondary | ICD-10-CM | POA: Diagnosis not present

## 2023-06-26 MED ORDER — PREDNISONE 20 MG PO TABS
20.0000 mg | ORAL_TABLET | Freq: Every day | ORAL | 0 refills | Status: AC
  Filled 2023-06-26: qty 5, 5d supply, fill #0

## 2023-06-26 MED ORDER — AMOXICILLIN-POT CLAVULANATE 875-125 MG PO TABS
1.0000 | ORAL_TABLET | Freq: Two times a day (BID) | ORAL | 0 refills | Status: AC
  Filled 2023-06-26: qty 14, 7d supply, fill #0

## 2023-06-26 MED ORDER — DM-GUAIFENESIN ER 30-600 MG PO TB12: 1.0000 | ORAL_TABLET | Freq: Two times a day (BID) | ORAL | Status: AC | PRN

## 2023-06-26 NOTE — ED Provider Notes (Signed)
[facility]    CSN: [id number] Arrival date & time: 06/26/23  8841      History   Chief Complaint Chief Complaint  Patient presents with   Nasal Congestion    Sinus issue with headache.  Ongoing for one week. - Entered by patient    HPI Charles James is a 58 y.o. male.   Patient reports she was traveling in [place] for work for over 2 weeks.  While he was overseas he was in many hotel rooms and on several planes.  He developed head congestion, cough and some possible fevers.  He came home on Thursday, 06/22/2023.  He had a fever that day.  He has significant head congestion.  Denies chest congestion.  The fever has gone away but he has significant facial pain and pressure especially with head movement or a sneeze.  He denies nausea, vomiting, diarrhea, constipation.  He denies dental pain but definitely has sinus pain and feels like he has sinusitis.     Past Medical History:  Diagnosis Date   Family history of coronary artery bypass surgery    High cholesterol    Hypertension    Mass of shoulder region     There are no active problems to display for this patient.   Past Surgical History:  Procedure Laterality Date   VASECTOMY         Home Medications    Prior to Admission medications   Medication Sig Start Date End Date Taking? Authorizing Provider  amoxicillin-clavulanate (AUGMENTIN) 875-125 MG tablet Take 1 tablet by mouth every 12 (twelve) hours for 7 days. 06/26/23 07/03/23 Yes Prescilla Sours, FNP  cetirizine (ZYRTEC) 10 MG tablet Take 10 mg by mouth daily.   Yes [provider]  dextromethorphan-guaiFENesin (MUCINEX DM) 30-600 MG 12hr tablet Take 1 tablet by mouth 2 (two) times daily as needed for up to 15 days (congested sinuses). 06/26/23 07/11/23 Yes Prescilla Sours, FNP  predniSONE (DELTASONE) 20 MG tablet Take 1 tablet (20 mg total) by mouth daily with breakfast for 5 days. 06/26/23 07/01/23 Yes Prescilla Sours, FNP  tadalafil (CIALIS) 5 MG tablet  Take 5 mg by mouth daily as needed for erectile dysfunction.   Yes [provider]  ibuprofen (ADVIL) 600 MG tablet Take 1 tablet (600 mg total) by mouth every 6 (six) hours as needed. 03/02/19   Mickie Bail, NP  Multiple Vitamins-Minerals (ONE DAILY FOR MEN/LYCOPENE PO) Take by mouth.    [provider]    Family History Family History  Problem Relation Age of Onset   Hypertension Mother    Hypertension Father    Hypertension Brother    Heart disease Brother    Hypertension Brother     Social History Social History   Tobacco Use   Smoking status: Never   Smokeless tobacco: Never  Substance Use Topics   Alcohol use: Yes     Allergies   Atorvastatin   Review of Systems Review of Systems  Constitutional:  Negative for chills and fever.  HENT:  Positive for postnasal drip, rhinorrhea, sinus pressure and sinus pain. Negative for ear pain and sore throat.   Eyes:  Negative for pain and visual disturbance.  Respiratory:  Negative for cough and shortness of breath.   Cardiovascular:  Negative for chest pain and palpitations.  Gastrointestinal:  Negative for abdominal pain, constipation, diarrhea, nausea and vomiting.  Genitourinary:  Negative for dysuria and hematuria.  Musculoskeletal:  Negative for arthralgias and back pain.  Skin:  Negative for color change and rash.  Neurological:  Negative for seizures and syncope.  All other systems reviewed and are negative.    Physical Exam Triage Vital Signs ED Triage Vitals  Encounter Vitals Group     BP 06/26/23 0911 135/78     Systolic BP Percentile --      Diastolic BP Percentile --      Pulse Rate 06/26/23 0911 70     Resp 06/26/23 0911 18     Temp 06/26/23 0911 98.9 F (37.2 C)     Temp Source 06/26/23 0911 Oral     SpO2 06/26/23 0911 96 %     Weight --      Height --      Head Circumference --      Peak Flow --      Pain Score 06/26/23 0909 0     Pain Loc --      Pain Education --       Exclude from Growth Chart --    No data found.  Updated Vital Signs BP 135/78 (BP Location: Right Arm)   Pulse 70   Temp 98.9 F (37.2 C) (Oral)   Resp 18   SpO2 96%   Visual Acuity Right Eye Distance:   Left Eye Distance:   Bilateral Distance:    Right Eye Near:   Left Eye Near:    Bilateral Near:     Physical Exam Vitals and nursing note reviewed.  Constitutional:      General: He is not in acute distress.    Appearance: He is well-developed. He is not ill-appearing or toxic-appearing.  HENT:     Head: Normocephalic and atraumatic.     Right Ear: Hearing, tympanic membrane, ear canal and external ear normal.     Left Ear: Hearing, tympanic membrane, ear canal and external ear normal.     Nose: Mucosal edema, congestion and rhinorrhea present. Rhinorrhea is clear.     Right Sinus: Maxillary sinus tenderness and frontal sinus tenderness present.     Left Sinus: Maxillary sinus tenderness and frontal sinus tenderness present.     Mouth/Throat:     Lips: Pink.     Mouth: Mucous membranes are moist.     Pharynx: Uvula midline. No oropharyngeal exudate or posterior oropharyngeal erythema.     Tonsils: No tonsillar exudate.  Eyes:     Conjunctiva/sclera: Conjunctivae normal.     Pupils: Pupils are equal, round, and reactive to light.  Cardiovascular:     Rate and Rhythm: Normal rate and regular rhythm.     Heart sounds: S1 normal and S2 normal. No murmur heard. Pulmonary:     Effort: Pulmonary effort is normal. No respiratory distress.     Breath sounds: Rales present. No decreased breath sounds, wheezing or rhonchi.  Abdominal:     Palpations: Abdomen is soft.     Tenderness: There is no abdominal tenderness.  Musculoskeletal:        General: No swelling.     Cervical back: Neck supple.  Lymphadenopathy:     Head:     Right side of head: No submental, submandibular, tonsillar, preauricular or posterior auricular adenopathy.     Left side of head: No submental,  submandibular, tonsillar, preauricular or posterior auricular adenopathy.     Cervical: Cervical adenopathy present.     Right cervical: Superficial cervical adenopathy present.     Left cervical: Superficial cervical adenopathy present.  Skin:    General:  Skin is warm and dry.     Capillary Refill: Capillary refill takes less than 2 seconds.     Findings: No rash.  Neurological:     Mental Status: He is alert and oriented to person, place, and time.  Psychiatric:        Mood and Affect: Mood normal.      UC Treatments / Results  Labs (all labs ordered are listed, but only abnormal results are displayed) Labs Reviewed - No data to display  EKG   Radiology No results found.  Procedures Procedures (including critical care time)  Medications Ordered in UC Medications - No data to display  Initial Impression / Assessment and Plan / UC Course  I have reviewed the triage vital signs and the nursing notes.  Pertinent labs & imaging results that were available during my care of the patient were reviewed by me and considered in my medical decision making (see chart for details).  Exam and history are most consistent with sinusitis.  Encourage sinus rinses and he has a Nettie pot at home.  Prednisone 20 mg, 1, daily for 5 days.  Augmentin, 875/125 mg, 1, twice daily for sinusitis.  Encouraged OTC Mucinex DM as directed on the package.  Get plenty of fluids and rest.  Reviewed reasons to return here.  Declined a work excuse.  Follow-up if symptoms do not improve, worsen or new symptoms occur. Final Clinical Impressions(s) / UC Diagnoses   Final diagnoses:  Acute non-recurrent pansinusitis  Fever, unspecified  Facial pain     Discharge Instructions      Exam is consistent with sinusitis.  Encourage sinus nasal rinses.  Prednisone 20 mg, 1, daily for 5 days.  Augmentin, 875/125 mg, 1, twice daily after food, for sinusitis.  Lots of fluids.  Tylenol or Motrin for fever or sinus  pressure.  Use Mucinex DM to make nasal secretions thinner.  Take Mucinex DM as directed on the package.  Recheck if symptoms do not improve, worsen or new symptoms occur.     ED Prescriptions     Medication Sig Dispense Auth. Provider   amoxicillin-clavulanate (AUGMENTIN) 875-125 MG tablet Take 1 tablet by mouth every 12 (twelve) hours for 7 days. 14 tablet Prescilla Sours, FNP   predniSONE (DELTASONE) 20 MG tablet Take 1 tablet (20 mg total) by mouth daily with breakfast for 5 days. 5 tablet Prescilla Sours, FNP   dextromethorphan-guaiFENesin [facility] DM) 30-600 MG 12hr tablet Take 1 tablet by mouth 2 (two) times daily as needed for up to 15 days (congested sinuses). -- Prescilla Sours, FNP      PDMP not reviewed this encounter.   Prescilla Sours, FNP 06/26/23 1024

## 2023-06-26 NOTE — ED Triage Notes (Signed)
Pt reports he thinks he has a sinus infection has been traveling for 2 weeks he returned home on Thursday. He has c/o sinus pressure, headache.

## 2023-06-26 NOTE — Discharge Instructions (Signed)
Exam is consistent with sinusitis.  Encourage sinus nasal rinses.  Prednisone 20 mg, 1, daily for 5 days.  Augmentin, 875/125 mg, 1, twice daily after food, for sinusitis.  Lots of fluids.  Tylenol or Motrin for fever or sinus pressure.  Use Mucinex DM to make nasal secretions thinner.  Take Mucinex DM as directed on the package.  Recheck if symptoms do not improve, worsen or new symptoms occur.

## 2023-07-25 ENCOUNTER — Other Ambulatory Visit (HOSPITAL_BASED_OUTPATIENT_CLINIC_OR_DEPARTMENT_OTHER): Payer: Self-pay | Admitting: Physician Assistant

## 2023-07-25 DIAGNOSIS — E7800 Pure hypercholesterolemia, unspecified: Secondary | ICD-10-CM

## 2023-08-11 ENCOUNTER — Ambulatory Visit (HOSPITAL_COMMUNITY)
Admission: RE | Admit: 2023-08-11 | Discharge: 2023-08-11 | Disposition: A | Discharge status: Home / Self Care | Payer: Self-pay | Source: Ambulatory Visit | Attending: Physician Assistant | Admitting: Physician Assistant

## 2023-08-11 DIAGNOSIS — E7800 Pure hypercholesterolemia, unspecified: Secondary | ICD-10-CM | POA: Insufficient documentation
